# Patient Record
Sex: Male | Born: 2000 | Race: White | Hispanic: No | Marital: Single | Smoking: Never smoker
Health system: Southern US, Community
[De-identification: ages and names within clinical notes are randomized; demographics above are authoritative.]

## PROBLEM LIST (undated history)

## (undated) DIAGNOSIS — J45909 Unspecified asthma, uncomplicated: Secondary | ICD-10-CM

---
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
## 2019-08-14 ENCOUNTER — Other Ambulatory Visit: Payer: Self-pay

## 2019-08-14 ENCOUNTER — Encounter (HOSPITAL_BASED_OUTPATIENT_CLINIC_OR_DEPARTMENT_OTHER): Payer: Self-pay | Admitting: *Deleted

## 2019-08-14 ENCOUNTER — Emergency Department (HOSPITAL_BASED_OUTPATIENT_CLINIC_OR_DEPARTMENT_OTHER)
Admission: EM | Admit: 2019-08-14 | Discharge: 2019-08-14 | Disposition: A | Discharge status: Home / Self Care | Payer: Worker's Compensation | Attending: Emergency Medicine | Admitting: Emergency Medicine

## 2019-08-14 DIAGNOSIS — Z23 Encounter for immunization: Secondary | ICD-10-CM | POA: Diagnosis not present

## 2019-08-14 DIAGNOSIS — Y999 Unspecified external cause status: Secondary | ICD-10-CM | POA: Insufficient documentation

## 2019-08-14 DIAGNOSIS — Y929 Unspecified place or not applicable: Secondary | ICD-10-CM | POA: Diagnosis not present

## 2019-08-14 DIAGNOSIS — S61210A Laceration without foreign body of right index finger without damage to nail, initial encounter: Secondary | ICD-10-CM | POA: Diagnosis present

## 2019-08-14 DIAGNOSIS — Y939 Activity, unspecified: Secondary | ICD-10-CM | POA: Insufficient documentation

## 2019-08-14 DIAGNOSIS — W260XXA Contact with knife, initial encounter: Secondary | ICD-10-CM | POA: Insufficient documentation

## 2019-08-14 MED ORDER — LIDOCAINE HCL (PF) 1 % IJ SOLN
5.0000 mL | Freq: Once | INTRAMUSCULAR | Status: AC
  Administered 2019-08-14: 5 mL
  Filled 2019-08-14: qty 5

## 2019-08-14 MED ORDER — TETANUS-DIPHTH-ACELL PERTUSSIS 5-2.5-18.5 LF-MCG/0.5 IM SUSP
0.5000 mL | Freq: Once | INTRAMUSCULAR | Status: AC
  Administered 2019-08-14: 0.5 mL via INTRAMUSCULAR
  Filled 2019-08-14: qty 0.5

## 2019-08-14 MED ORDER — BACITRACIN ZINC 500 UNIT/GM EX OINT: TOPICAL_OINTMENT | Freq: Two times a day (BID) | CUTANEOUS | Status: DC

## 2019-08-14 NOTE — ED Triage Notes (Signed)
Laceration to rt index finger, cut by pocket knife

## 2019-08-14 NOTE — ED Provider Notes (Signed)
[facility] EMERGENCY DEPARTMENT Provider Note   CSN: [id number] Arrival date & time: 08/14/19  6433     History Chief Complaint  Patient presents with  . Laceration    Jesse Marshall is a 19 y.o. male.  HPI      19yo male presents with laceration to base of right index finger from a pocket knife. Occurred approximately 20 minutes prior to arrival.  Bleeding is controlled. Unknown last tetanus. No numbness or weakness.  No other concerns.  Past Medical History:  Diagnosis Date  . Asthma     There are no problems to display for this patient.   History reviewed. No pertinent surgical history.     History reviewed. No pertinent family history.  Social History   Tobacco Use  . Smoking status: Never Smoker  Substance Use Topics  . Alcohol use: No  . Drug use: Never    Home Medications Prior to Admission medications   Not on File    Allergies    Patient has no known allergies.  Review of Systems   Review of Systems  Constitutional: Negative for fever.  Musculoskeletal: Negative for arthralgias.  Skin: Positive for wound.  Neurological: Negative for weakness and numbness.    Physical Exam Updated Vital Signs BP (!) 150/83 (BP Location: Right Arm)   Pulse 80   Temp 98 F (36.7 C) (Oral)   Resp 16   Ht 5\' 11"  (1.803 m)   Wt 61.2 kg   SpO2 100%   BMI 18.83 kg/m   Physical Exam Vitals and nursing note reviewed.  Constitutional:      General: He is not in acute distress.    Appearance: Normal appearance. He is not ill-appearing, toxic-appearing or diaphoretic.  HENT:     Head: Normocephalic.  Eyes:     Conjunctiva/sclera: Conjunctivae normal.  Cardiovascular:     Rate and Rhythm: Normal rate and regular rhythm.     Pulses: Normal pulses.  Pulmonary:     Effort: Pulmonary effort is normal. No respiratory distress.  Musculoskeletal:        General: No tenderness, deformity or signs of injury.     Cervical back: No rigidity.    Skin:    General: Skin is warm and dry.     Coloration: Skin is not jaundiced or pale.     Comments: 3cm laceration to index finger just distal to MCP on radial side, full ROM of finger in flexion and extension, normal sensation/cap refill  Neurological:     General: No focal deficit present.     Mental Status: He is alert and oriented to person, place, and time.     ED Results / Procedures / Treatments   Labs (all labs ordered are listed, but only abnormal results are displayed) Labs Reviewed - No data to display  EKG None  Radiology No results found.  Procedures . Laceration Repair  Date/Time: 08/14/2019 8:13 AM Performed by: 10/14/2019, MD Authorized by: Alvira Monday, MD   Consent:    Consent obtained:  Verbal   Consent given by:  Patient   Risks discussed:  Infection, pain, poor cosmetic result, poor wound healing and nerve damage   Alternatives discussed:  No treatment Anesthesia (see MAR for exact dosages):    Anesthesia method:  Local infiltration   Local anesthetic:  Lidocaine 1% w/o epi Laceration details:    Location:  Finger   Finger location:  R index finger   Length (cm):  3  Depth (mm):  2 Repair type:    Repair type:  Simple Pre-procedure details:    Preparation:  Patient was prepped and draped in usual sterile fashion Exploration:    Wound exploration: wound explored through full range of motion     Contaminated: no   Treatment:    Area cleansed with:  Betadine, saline and soap and water   Irrigation solution:  Sterile saline and tap water   Irrigation method:  Tap and syringe Skin repair:    Repair method:  Sutures   Suture size:  4-0   Suture material:  Prolene   Suture technique:  Simple interrupted   Number of sutures:  2 Approximation:    Approximation:  Close   (including critical care time)  Medications Ordered in ED Medications  Tdap (BOOSTRIX) injection 0.5 mL (0.5 mLs Intramuscular Given 08/14/19 0705)  lidocaine  (PF) (XYLOCAINE) 1 % injection 5 mL (5 mLs Infiltration Given 08/14/19 7425)    ED Course  I have reviewed the triage vital signs and the nursing notes.  Pertinent labs & imaging results that were available during my care of the patient were reviewed by me and considered in my medical decision making (see chart for details).    MDM Rules/Calculators/A&P                      Very pleasant Loistine Simas male presents with laceration to base of right index finger from a pocket knife.  TDaP updated. NV intact. No sign of tendon injury.  Laceration repaired with 2 sutures as above. Patient discharged in stable condition with understanding of reasons to return.   Final Clinical Impression(s) / ED Diagnoses Final diagnoses:  Laceration of right index finger without foreign body without damage to nail, initial encounter    Rx / DC Orders ED Discharge Orders    None       Gareth Morgan, MD 08/14/19 [phone]

## 2019-08-14 NOTE — ED Provider Notes (Signed)
MSE was initiated and I personally evaluated the patient and placed orders (if any) at  6:57 AM on Aug 14, 2019.  The patient appears stable so that the remainder of the MSE may be completed by another provider.  Patient sustained laceration at work.  Laceration to right index finger by pocket knife.  Unknown last tetanus.  Bleeding is controlled.  Pain is controlled.  Laceration is approximately 3 cm over the lateral dorsal aspect of the right second digit just distal to the MCP joint. flexion and extension of the fingers is intact.  Tetanus updated.  Will order lidocaine as it likely needs 1 suture to approximate.   Shon Baton, MD 08/14/19 [phone]

## 2019-08-21 ENCOUNTER — Encounter (HOSPITAL_BASED_OUTPATIENT_CLINIC_OR_DEPARTMENT_OTHER): Payer: Self-pay

## 2019-08-21 ENCOUNTER — Other Ambulatory Visit: Payer: Self-pay

## 2019-08-21 ENCOUNTER — Emergency Department (HOSPITAL_BASED_OUTPATIENT_CLINIC_OR_DEPARTMENT_OTHER)
Admission: EM | Admit: 2019-08-21 | Discharge: 2019-08-21 | Disposition: A | Discharge status: Home / Self Care | Payer: Worker's Compensation | Attending: Emergency Medicine | Admitting: Emergency Medicine

## 2019-08-21 DIAGNOSIS — Z4802 Encounter for removal of sutures: Secondary | ICD-10-CM | POA: Diagnosis not present

## 2019-08-21 DIAGNOSIS — X58XXXA Exposure to other specified factors, initial encounter: Secondary | ICD-10-CM | POA: Diagnosis not present

## 2019-08-21 DIAGNOSIS — S61210D Laceration without foreign body of right index finger without damage to nail, subsequent encounter: Secondary | ICD-10-CM | POA: Insufficient documentation

## 2019-08-21 NOTE — ED Triage Notes (Signed)
Pt for suture removal to right index finger-NAD-steady gait

## 2019-08-21 NOTE — ED Notes (Signed)
ED Provider at bedside. 

## 2019-08-21 NOTE — ED Provider Notes (Signed)
[facility] EMERGENCY DEPARTMENT Provider Note   CSN: [id number] Arrival date & time: 08/21/19  1540     History Chief Complaint  Patient presents with  . Suture / Staple Removal    Jesse Marshall is a 19 y.o. male who presents for evaluation of suture removal.  He was seen here on 08/14/2019 for evaluation of laceration to right index finger.  He reports that he has been keeping the area clean.  He has not noticed any overlying warmth, erythema.  She denies any drainage from the area.  The history is provided by the patient.       Past Medical History:  Diagnosis Date  . Asthma     There are no problems to display for this patient.   History reviewed. No pertinent surgical history.     No family history on file.  Social History   Tobacco Use  . Smoking status: Never Smoker  . Smokeless tobacco: Never Used  Substance Use Topics  . Alcohol use: No  . Drug use: Never    Home Medications Prior to Admission medications   Not on File    Allergies    Patient has no known allergies.  Review of Systems   Review of Systems  Constitutional: Negative for fever.  Skin: Negative for color change and wound.    Physical Exam Updated Vital Signs BP 122/72 (BP Location: Left Arm)   Pulse 68   Resp 16   SpO2 99%   Physical Exam Vitals and nursing note reviewed.  Constitutional:      Appearance: He is well-developed.  HENT:     Head: Normocephalic and atraumatic.  Eyes:     General: No scleral icterus.       Right eye: No discharge.        Left eye: No discharge.     Conjunctiva/sclera: Conjunctivae normal.  Pulmonary:     Effort: Pulmonary effort is normal.  Skin:    General: Skin is warm and dry.     Comments: Right index finger with sutures in place.  No surrounding warmth, erythema.  No active drainage.  Neurological:     Mental Status: He is alert.  Psychiatric:        Speech: Speech normal.        Behavior: Behavior normal.     ED  Results / Procedures / Treatments   Labs (all labs ordered are listed, but only abnormal results are displayed) Labs Reviewed - No data to display  EKG None  Radiology No results found.  Procedures .Suture Removal  Date/Time: 08/21/2019 5:32 PM Performed by: Maxwell Caul, PA-C Authorized by: Maxwell Caul, PA-C   Consent:    Consent obtained:  Verbal   Consent given by:  Patient   Risks discussed:  Bleeding and pain Location:    Location:  Upper extremity   Upper extremity location:  Hand   Hand location:  R index finger Procedure details:    Wound appearance:  No signs of infection   Number of sutures removed:  2 Post-procedure details:    Post-removal:  Antibiotic ointment applied and dressing applied   Patient tolerance of procedure:  Tolerated well, no immediate complications   (including critical care time)  Medications Ordered in ED Medications - No data to display  ED Course  I have reviewed the triage vital signs and the nursing notes.  Pertinent labs & imaging results that were available during my care of the patient  were reviewed by me and considered in my medical decision making (see chart for details).    MDM Rules/Calculators/A&P                      19 year old male who presents for evaluation of suture removal.  Was seen here on 08/14/2019 for right index laceration.  Had sutures in place.  No surrounding warmth, erythema.  No active drainage.  Initially arrival, he is afebrile, nontoxic-appearing.  Vital signs are stable.  No signs of infectious etiology on exam.  Sutures removed.  Encouraged at home supportive care measures. Patient had ample opportunity for questions and discussion. All patient's questions were answered with full understanding. Strict return precautions discussed. Patient expresses understanding and agreement to plan.   Portions of this note were generated with [employer]. Dictation errors may occur despite best  attempts at proofreading   Final Clinical Impression(s) / ED Diagnoses Final diagnoses:  Visit for suture removal    Rx / DC Orders ED Discharge Orders    None       Desma Mcgregor 08/21/19 2256    Hayden Rasmussen, MD 08/22/19 [phone]

## 2019-08-21 NOTE — Discharge Instructions (Signed)
Continue to keep area clean and dry.  Return to the Emergency dept for any warmth, redeness, fevers.

## 2022-02-01 ENCOUNTER — Ambulatory Visit
Admission: EM | Admit: 2022-02-01 | Discharge: 2022-02-01 | Disposition: A | Discharge status: Home / Self Care | Payer: Commercial Managed Care - PPO | Attending: Emergency Medicine | Admitting: Emergency Medicine

## 2022-02-01 DIAGNOSIS — K1379 Other lesions of oral mucosa: Secondary | ICD-10-CM | POA: Diagnosis not present

## 2022-02-01 LAB — CBC WITH DIFFERENTIAL/PLATELET
Abs Immature Granulocytes: 0.02 10*3/uL (ref 0.00–0.07)
Basophils Absolute: 0 10*3/uL (ref 0.0–0.1)
Basophils Relative: 1 %
Eosinophils Absolute: 0.1 10*3/uL (ref 0.0–0.5)
Eosinophils Relative: 1 %
HCT: 41.6 % (ref 39.0–52.0)
Hemoglobin: 14.2 g/dL (ref 13.0–17.0)
Immature Granulocytes: 0 %
Lymphocytes Relative: 25 %
Lymphs Abs: 1.4 10*3/uL (ref 0.7–4.0)
MCH: 29.1 pg (ref 26.0–34.0)
MCHC: 34.1 g/dL (ref 30.0–36.0)
MCV: 85.2 fL (ref 80.0–100.0)
Monocytes Absolute: 0.6 10*3/uL (ref 0.1–1.0)
Monocytes Relative: 11 %
Neutro Abs: 3.4 10*3/uL (ref 1.7–7.7)
Neutrophils Relative %: 62 %
Platelets: 207 10*3/uL (ref 150–400)
RBC: 4.88 MIL/uL (ref 4.22–5.81)
RDW: 13.1 % (ref 11.5–15.5)
WBC: 5.4 10*3/uL (ref 4.0–10.5)
nRBC: 0 % (ref 0.0–0.2)

## 2022-02-01 LAB — COMPREHENSIVE METABOLIC PANEL
ALT: 16 U/L (ref 0–44)
AST: 20 U/L (ref 15–41)
Albumin: 4.2 g/dL (ref 3.5–5.0)
Alkaline Phosphatase: 67 U/L (ref 38–126)
Anion gap: 4 — ABNORMAL LOW (ref 5–15)
BUN: 18 mg/dL (ref 6–20)
CO2: 26 mmol/L (ref 22–32)
Calcium: 8.8 mg/dL — ABNORMAL LOW (ref 8.9–10.3)
Chloride: 106 mmol/L (ref 98–111)
Creatinine, Ser: 0.87 mg/dL (ref 0.61–1.24)
GFR, Estimated: >60 mL/min (ref >60)
Glucose, Bld: 96 mg/dL (ref 70–99)
Potassium: 4 mmol/L (ref 3.5–5.1)
Sodium: 136 mmol/L (ref 135–145)
Total Bilirubin: 0.3 mg/dL (ref 0.3–1.2)
Total Protein: 7.9 g/dL (ref 6.5–8.1)

## 2022-02-01 LAB — PROTIME-INR
INR: 1.2 (ref 0.8–1.2)
Prothrombin Time: 14.7 seconds (ref 11.4–15.2)

## 2022-02-01 LAB — APTT: aPTT: 35 seconds (ref 24–36)

## 2022-02-01 MED ORDER — AMINOCAPROIC ACID 0.25 GM/ML PO SOLN: 5.0000 mL | ORAL | 0 refills | Status: AC

## 2022-02-01 MED ORDER — GELATIN ABSORBABLE 12-7 MM EX MISC
1.0000 | Freq: Once | CUTANEOUS | Status: AC
  Administered 2022-02-01: 1 via TOPICAL

## 2022-02-01 NOTE — ED Provider Notes (Signed)
HPI  SUBJECTIVE:  Jesse Marshall is a 21 y.o. male who presents with 3 days of atraumatic oral bleeding.  He states that he could not stop it today.  He notes a mass on his left lower teeth/gums starting yesterday.  He is not sure if this is what is bleeding.  He is not sure if there is any gingival swelling.  No trauma to the area, dental pain, recent extraction.  No epistaxis, petechial rash, hematuria, melena, hematochezia, dizziness, lightheadedness, shortness of breath, chest pain.  He is not on any anticoagulants or antiplatelets.  No excess alcohol or NSAID use.  He has never had symptoms like this before.  He has been applying pressure with paper towels and doing salt water rinses with temporary improvement in symptoms.  Past medical history negative for coagulopathy, thrombocytopenia.  He has a history of asthma.  PCP: None.   Past Medical History:  Diagnosis Date   Asthma     History reviewed. No pertinent surgical history.  History reviewed. No pertinent family history.  Social History   Tobacco Use   Smoking status: Never   Smokeless tobacco: Never  Vaping Use   Vaping Use: Every day  Substance Use Topics   Alcohol use: No   Drug use: Not Currently    Types: Marijuana    No current facility-administered medications for this encounter.  Current Outpatient Medications:    aminocaproic acid (AMICAR) oral solution 50 mg/mL (5%) mixture, Take 5 mLs by mouth every 2 (two) hours. Swish and spit.  Do not swallow, Disp: 150 mL, Rfl: 0  No Known Allergies   ROS  As noted in HPI.   Physical Exam  BP 125/87 (BP Location: Left Arm)   Pulse 72   Temp 98.2 F (36.8 C) (Oral)   Resp 18   Ht 5\' 11"  (1.803 m)   Wt 63.5 kg   SpO2 100%   BMI 19.53 kg/m   Constitutional: Well developed, well nourished, no acute distress Eyes:  EOMI, conjunctiva normal bilaterally HENT: Normocephalic, atraumatic,mucus membranes moist.  Friable, bleeding mass left lower mouth.  No  gingival swelling, tenderness.  No swelling under the tongue     Respiratory: Normal inspiratory effort Cardiovascular: Normal rate GI: nondistended skin: No rash, skin intact Musculoskeletal: no deformities Neurologic: Alert & oriented x 3, no focal neuro deficits Psychiatric: Speech and behavior appropriate   ED Course   Medications  gelatin adsorbable (GELFOAM/SURGIFOAM) sponge 12-7 mm 1 each (1 each Topical Given by Other 02/01/22 0919)    Orders Placed This Encounter  Procedures   CBC with Differential    Standing Status:   Standing    Number of Occurrences:   1   Comprehensive metabolic panel    Standing Status:   Standing    Number of Occurrences:   1   Protime-INR    Standing Status:   Standing    Number of Occurrences:   1   APTT    Standing Status:   Standing    Number of Occurrences:   1    Results for orders placed or performed during the hospital encounter of 02/01/22 (from the past 24 hour(s))  CBC with Differential     Status: None   Collection Time: 02/01/22  9:10 AM  Result Value Ref Range   WBC 5.4 4.0 - 10.5 K/uL   RBC 4.88 4.22 - 5.81 MIL/uL   Hemoglobin 14.2 13.0 - 17.0 g/dL   HCT 02/03/22 12.8 - 78.6 %  MCV 85.2 80.0 - 100.0 fL   MCH 29.1 26.0 - 34.0 pg   MCHC 34.1 30.0 - 36.0 g/dL   RDW 50.0 93.8 - 18.2 %   Platelets 207 150 - 400 K/uL   nRBC 0.0 0.0 - 0.2 %   Neutrophils Relative % 62 %   Neutro Abs 3.4 1.7 - 7.7 K/uL   Lymphocytes Relative 25 %   Lymphs Abs 1.4 0.7 - 4.0 K/uL   Monocytes Relative 11 %   Monocytes Absolute 0.6 0.1 - 1.0 K/uL   Eosinophils Relative 1 %   Eosinophils Absolute 0.1 0.0 - 0.5 K/uL   Basophils Relative 1 %   Basophils Absolute 0.0 0.0 - 0.1 K/uL   Immature Granulocytes 0 %   Abs Immature Granulocytes 0.02 0.00 - 0.07 K/uL  Comprehensive metabolic panel     Status: Abnormal   Collection Time: 02/01/22  9:10 AM  Result Value Ref Range   Sodium 136 135 - 145 mmol/L   Potassium 4.0 3.5 - 5.1 mmol/L    Chloride 106 98 - 111 mmol/L   CO2 26 22 - 32 mmol/L   Glucose, Bld 96 70 - 99 mg/dL   BUN 18 6 - 20 mg/dL   Creatinine, Ser 9.93 0.61 - 1.24 mg/dL   Calcium 8.8 (L) 8.9 - 10.3 mg/dL   Total Protein 7.9 6.5 - 8.1 g/dL   Albumin 4.2 3.5 - 5.0 g/dL   AST 20 15 - 41 U/L   ALT 16 0 - 44 U/L   Alkaline Phosphatase 67 38 - 126 U/L   Total Bilirubin 0.3 0.3 - 1.2 mg/dL   GFR, Estimated >71 >69 mL/min   Anion gap 4 (L) 5 - 15  Protime-INR     Status: None   Collection Time: 02/01/22  9:10 AM  Result Value Ref Range   Prothrombin Time 14.7 11.4 - 15.2 seconds   INR 1.2 0.8 - 1.2  APTT     Status: None   Collection Time: 02/01/22  9:10 AM  Result Value Ref Range   aPTT 35 24 - 36 seconds   No results found.  ED Clinical Impression  1. Oral bleeding   2. Oral mass      ED Assessment/Plan     Patient with an oral atraumatic bleeding mass.  Checking CBC for platelets, CMP for liver enzymes, PT, PTT, INR.  We will have staff set patient up with a PCP prior to discharge.  Phone number [phone]  CMP, AST, ALT normal.  CBC normal with normal number of platelets.  PT, PTT, INR pending.  We will contact patient if and only if this comes back abnormal.  Coags normal.  Procedure note: Cut a piece of Gelfoam, and pressed down on top of the clot to form an artificial clot.  Molded to the gum.  Had patient apply pressure afterwards.  Discussed case with Dr. Ross Marcus, oral surgery on-call, recommends aminocaproic acid mouth rinse every 2 hours.  Max 6 doses per day.  Patient is to call Dr. Joie Bimler office and follow-up with him within the week.  ER return precautions given.  Patient was set up with a PCP prior to discharge.  Discussed labs, MDM, treatment plan, and plan for follow-up with patient. Discussed sn/sx that should prompt return to the ED. patient agrees with plan.   Meds ordered this encounter  Medications   gelatin adsorbable (GELFOAM/SURGIFOAM) sponge 12-7 mm 1 each    aminocaproic acid (AMICAR) oral solution 50  mg/mL (5%) mixture    Sig: Take 5 mLs by mouth every 2 (two) hours. Swish and spit.  Do not swallow    Dispense:  150 mL    Refill:  0      *This clinic note was created using [employer]. Therefore, there may be occasional mistakes despite careful proofreading.  ?    Melynda Ripple, MD 02/01/22 [phone]

## 2022-02-01 NOTE — Discharge Instructions (Addendum)
Use the Amicar oral solution to stop the bleeding.  5 mL every 2 hours.  Swish and spit.  Do not swallow this.  You can use it up to 6 times per day.  Please follow-up with Dr. Conley Simmonds, oral surgery within the week.  I have talked to him, and he is expecting your call.  Call his office to arrange an appointment.  Go to the ER for uncontrolled bleeding, chest pain, shortness of breath, lightheadedness, dizziness, if you pass out, or for any other concerns.  If [place] is too far, then go to [employer] [facility].  I will call you if and only if the rest of your lab work is abnormal.  If you do not hear from you by the end of the day, you can assume that they are all normal.

## 2022-02-01 NOTE — ED Triage Notes (Signed)
Pt c/o lower jaw and gum bleeding x3days.  Pt does not know how his mouth started to bleed and denies injury or fall. Pt states he was laying in his bed and tasted blood  Pt has stopped his mouth from bleeding by using paper towels and salt water.   Pt states that he is between jobs and does not have insurance.   Pt has a clot along the bottom right incisor and has blood spreading along the bottom teeth to the wisdom tooth.   Pt states that his mouth has not stopped bleeding as of yet.

## 2022-03-10 ENCOUNTER — Ambulatory Visit: Payer: Self-pay | Admitting: Nurse Practitioner

## 2022-11-17 ENCOUNTER — Ambulatory Visit
Admission: EM | Admit: 2022-11-17 | Discharge: 2022-11-17 | Disposition: A | Discharge status: Home / Self Care | Payer: Self-pay | Attending: Emergency Medicine | Admitting: Emergency Medicine

## 2022-11-17 DIAGNOSIS — Z720 Tobacco use: Secondary | ICD-10-CM

## 2022-11-17 DIAGNOSIS — M25511 Pain in right shoulder: Secondary | ICD-10-CM

## 2022-11-17 MED ORDER — CYCLOBENZAPRINE HCL 5 MG PO TABS: 5.0000 mg | ORAL_TABLET | Freq: Three times a day (TID) | ORAL | 0 refills | Status: AC | PRN

## 2022-11-17 NOTE — Discharge Instructions (Addendum)
May use over the counter biofreeze or lidocaine patch as label directed for pain. May take tylenol or ibuprofen as label directed for pain. May take muscle relaxer,do not drink or drive while taking medicine, avoid vaping.Follow up with [facility]-call for appt next week if symptoms persist and are not improving.  Rest,ice right shoulder 20 min 3 x daily.

## 2022-11-17 NOTE — ED Provider Notes (Signed)
[facility]    CSN: [id number] Arrival date & time: 11/17/22  1708      History   Chief Complaint Chief Complaint  Patient presents with   Shoulder Pain    HPI Jesse Marshall is a 22 y.o. male.   22 year old male pt, Jesse Marshall, presents to urgent care for evaluation of right shoulder pain x 2 days, no known injury.  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  Patient is taking over-the-counter meds: Tylenol, ibuprofen, Goody's for symptom management.  The history is provided by the patient. No language interpreter was used.    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 No pertinent surgical history.     Home Medications    Prior to Admission medications   Medication Sig Start Date End Date Taking? Authorizing Provider  cyclobenzaprine (FLEXERIL) 5 MG tablet Take 1 tablet (5 mg total) by mouth 3 (three) times daily as needed for up to 5 days for muscle spasms. 11/17/22 11/22/22 Yes Brennen Camper, Para March, NP  aminocaproic acid (AMICAR) oral solution 50 mg/mL (5%) mixture Take 5 mLs by mouth every 2 (two) hours. Swish and spit.  Do not swallow 02/01/22   Domenick Gong, MD    Family History History reviewed. No pertinent family history.  Social History Social History   Tobacco Use   Smoking status: Never   Smokeless tobacco: Never  Vaping Use   Vaping status: Every Day  Substance Use Topics   Alcohol use: No   Drug use: Not Currently    Types: Marijuana     Allergies   Patient has no known allergies.   Review of Systems Review of Systems  Musculoskeletal:  Positive for arthralgias and myalgias. Negative for joint swelling.  Skin: Negative.  Negative for color change, rash and wound.  All other systems reviewed and  are negative.    Physical Exam Triage Vital Signs ED Triage Vitals  Encounter Vitals Group     BP 11/17/22 1716 110/72     Systolic BP Percentile --      Diastolic BP Percentile --      Pulse Rate 11/17/22 1716 69     Resp --      Temp 11/17/22 1716 98.7 F (37.1 C)     Temp Source 11/17/22 1716 Oral     SpO2 11/17/22 1716 97 %     Weight --      Height --      Head Circumference --      Peak Flow --      Pain Score 11/17/22 1715 4     Pain Loc --      Pain Education --      Exclude from Growth Chart --    No data found.  Updated Vital Signs BP 110/72 (BP Location: Right Arm)   Pulse 69   Temp 98.7 F (37.1 C) (Oral)   SpO2 97%   Visual Acuity Right Eye Distance:   Left Eye Distance:   Bilateral Distance:    Right Eye Near:   Left Eye Near:    Bilateral Near:     Physical Exam Vitals and nursing note reviewed.  Constitutional:      General: He is not in  acute distress.    Appearance: He is well-developed and well-groomed.  HENT:     Head: Normocephalic and atraumatic.  Eyes:     Conjunctiva/sclera: Conjunctivae normal.  Cardiovascular:     Rate and Rhythm: Normal rate and regular rhythm.     Pulses: Normal pulses.          Radial pulses are 2+ on the right side and 2+ on the left side.     Heart sounds: No murmur heard. Pulmonary:     Effort: Pulmonary effort is normal. No respiratory distress.  Abdominal:     Palpations: Abdomen is soft.     Tenderness: There is no abdominal tenderness.  Musculoskeletal:        General: No swelling.     Right shoulder: No swelling, deformity, tenderness, bony tenderness or crepitus. Normal range of motion. Normal pulse.       Arms:     Cervical back: Neck supple.  Skin:    General: Skin is warm and dry.     Capillary Refill: Capillary refill takes less than 2 seconds.  Neurological:     General: No focal deficit present.     Mental Status: He is alert and oriented to person, place, and time.     Cranial  Nerves: No cranial nerve deficit.     Sensory: No sensory deficit.  Psychiatric:        Attention and Perception: Attention normal.        Mood and Affect: Mood normal.        Speech: Speech normal.        Behavior: Behavior normal. Behavior is cooperative.      UC Treatments / Results  Labs (all labs ordered are listed, but only abnormal results are displayed) Labs Reviewed - No data to display  EKG   Radiology No results found.  Procedures Procedures (including critical care time)  Medications Ordered in UC Medications - No data to display  Initial Impression / Assessment and Plan / UC Course  I have reviewed the triage vital signs and the nursing notes.  Pertinent labs & imaging results that were available during my care of the patient were reviewed by me and considered in my medical decision making (see chart for details).     Ddx: Right shoulder pain, musculoskeletal pain , strain ,vape user Final Clinical Impressions(s) / UC Diagnoses   Final diagnoses:  Acute pain of right shoulder  Vapes nicotine containing substance     Discharge Instructions      May use over the counter biofreeze or lidocaine patch as label directed for pain. May take tylenol or ibuprofen as label directed for pain. May take muscle relaxer,do not drink or drive while taking medicine, avoid vaping.Follow up with [facility]-call for appt next week if symptoms persist and are not improving.  Rest,ice right shoulder 20 min 3 x daily.      ED Prescriptions     Medication Sig Dispense Auth. Provider   cyclobenzaprine (FLEXERIL) 5 MG tablet Take 1 tablet (5 mg total) by mouth 3 (three) times daily as needed for up to 5 days for muscle spasms. 15 tablet Carlton Buskey, Para March, NP      PDMP not reviewed this encounter.   Clancy Gourd, NP 11/17/22 1745

## 2022-11-17 NOTE — ED Triage Notes (Signed)
Pt presents to UC c/o RT shoulder pain onset x2 days ago, pt denies any known injury, pt states he just woke up with pain.
